# Patient Record
Sex: Male | Born: 1970 | Race: Black or African American | Hispanic: No | Marital: Married | Smoking: Current some day smoker
Health system: Southern US, Community
[De-identification: ages and names within clinical notes are randomized; demographics above are authoritative.]

## PROBLEM LIST (undated history)

## (undated) DIAGNOSIS — I10 Essential (primary) hypertension: Secondary | ICD-10-CM

## (undated) HISTORY — PX: NO PAST SURGERIES: SHX2092

---

## 2002-07-25 ENCOUNTER — Encounter: Payer: Self-pay | Admitting: Emergency Medicine

## 2002-07-25 ENCOUNTER — Emergency Department (HOSPITAL_COMMUNITY): Admission: EM | Admit: 2002-07-25 | Discharge: 2002-07-25 | Payer: Self-pay | Admitting: Emergency Medicine

## 2010-04-16 ENCOUNTER — Emergency Department (HOSPITAL_COMMUNITY): Admission: EM | Admit: 2010-04-16 | Discharge: 2010-04-16 | Payer: Self-pay | Admitting: Emergency Medicine

## 2014-01-13 ENCOUNTER — Encounter (HOSPITAL_COMMUNITY): Payer: Self-pay | Admitting: Emergency Medicine

## 2014-01-13 ENCOUNTER — Observation Stay (HOSPITAL_COMMUNITY)
Admission: EM | Admit: 2014-01-13 | Discharge: 2014-01-14 | Disposition: A | Discharge status: Home / Self Care | Payer: BC Managed Care – PPO | Attending: Cardiovascular Disease | Admitting: Cardiovascular Disease

## 2014-01-13 ENCOUNTER — Emergency Department (HOSPITAL_COMMUNITY): Payer: BC Managed Care – PPO

## 2014-01-13 DIAGNOSIS — R072 Precordial pain: Principal | ICD-10-CM | POA: Insufficient documentation

## 2014-01-13 DIAGNOSIS — K219 Gastro-esophageal reflux disease without esophagitis: Secondary | ICD-10-CM | POA: Insufficient documentation

## 2014-01-13 DIAGNOSIS — I10 Essential (primary) hypertension: Secondary | ICD-10-CM | POA: Insufficient documentation

## 2014-01-13 DIAGNOSIS — F17200 Nicotine dependence, unspecified, uncomplicated: Secondary | ICD-10-CM | POA: Insufficient documentation

## 2014-01-13 DIAGNOSIS — G4730 Sleep apnea, unspecified: Secondary | ICD-10-CM | POA: Insufficient documentation

## 2014-01-13 HISTORY — DX: Essential (primary) hypertension: I10

## 2014-01-13 LAB — BASIC METABOLIC PANEL
ANION GAP: 14 (ref 5–15)
BUN: 13 mg/dL (ref 6–23)
CALCIUM: 9.4 mg/dL (ref 8.4–10.5)
CO2: 26 mEq/L (ref 19–32)
Chloride: 104 mEq/L (ref 96–112)
Creatinine, Ser: 1.21 mg/dL (ref 0.50–1.35)
GFR calc Af Amer: 85 mL/min — ABNORMAL LOW (ref >90)
GFR, EST NON AFRICAN AMERICAN: 73 mL/min — AB (ref >90)
GLUCOSE: 85 mg/dL (ref 70–99)
Potassium: 3.7 mEq/L (ref 3.7–5.3)
Sodium: 144 mEq/L (ref 137–147)

## 2014-01-13 LAB — CBC
HCT: 48.6 % (ref 39.0–52.0)
Hemoglobin: 16.4 g/dL (ref 13.0–17.0)
MCH: 27.6 pg (ref 26.0–34.0)
MCHC: 33.7 g/dL (ref 30.0–36.0)
MCV: 81.8 fL (ref 78.0–100.0)
PLATELETS: 239 10*3/uL (ref 150–400)
RBC: 5.94 MIL/uL — ABNORMAL HIGH (ref 4.22–5.81)
RDW: 12.8 % (ref 11.5–15.5)
WBC: 8.8 10*3/uL (ref 4.0–10.5)

## 2014-01-13 LAB — TROPONIN I: Troponin I: <0.3 ng/mL (ref <0.30)

## 2014-01-13 LAB — I-STAT TROPONIN, ED: Troponin i, poc: 0.02 ng/mL (ref 0.00–0.08)

## 2014-01-13 MED ORDER — ZOLPIDEM TARTRATE 5 MG PO TABS: 5.0000 mg | ORAL_TABLET | Freq: Every evening | ORAL | Status: DC | PRN

## 2014-01-13 MED ORDER — PANTOPRAZOLE SODIUM 40 MG PO TBEC
40.0000 mg | DELAYED_RELEASE_TABLET | Freq: Every day | ORAL | Status: DC
  Administered 2014-01-13 – 2014-01-14 (×2): 40 mg via ORAL
  Filled 2014-01-13 (×2): qty 1

## 2014-01-13 MED ORDER — GI COCKTAIL ~~LOC~~
30.0000 mL | Freq: Once | ORAL | Status: AC
  Administered 2014-01-13: 30 mL via ORAL
  Filled 2014-01-13: qty 30

## 2014-01-13 MED ORDER — ASPIRIN 81 MG PO CHEW: 324.0000 mg | CHEWABLE_TABLET | Freq: Once | ORAL | Status: DC

## 2014-01-13 MED ORDER — ONDANSETRON HCL 4 MG/2ML IJ SOLN: 4.0000 mg | Freq: Four times a day (QID) | INTRAMUSCULAR | Status: DC | PRN

## 2014-01-13 MED ORDER — AMLODIPINE BESYLATE 5 MG PO TABS
5.0000 mg | ORAL_TABLET | Freq: Every day | ORAL | Status: DC
  Administered 2014-01-13 – 2014-01-14 (×2): 5 mg via ORAL
  Filled 2014-01-13 (×2): qty 1

## 2014-01-13 MED ORDER — ENOXAPARIN SODIUM 40 MG/0.4ML ~~LOC~~ SOLN
40.0000 mg | SUBCUTANEOUS | Status: DC
  Filled 2014-01-13: qty 0.4

## 2014-01-13 MED ORDER — ALPRAZOLAM 0.25 MG PO TABS: 0.2500 mg | ORAL_TABLET | Freq: Two times a day (BID) | ORAL | Status: DC | PRN

## 2014-01-13 MED ORDER — ACETAMINOPHEN 325 MG PO TABS: 650.0000 mg | ORAL_TABLET | ORAL | Status: DC | PRN

## 2014-01-13 NOTE — ED Notes (Signed)
Pt back from xray at this time.

## 2014-01-13 NOTE — H&P (Signed)
Physician History and Physical    Jordan GammaMarvin Phillips MRN: [id number] DOB/AGE: 41/01/1971 41 y.o. Admit date: 01/13/2014  Primary Care Physician: None Primary Cardiologist: None  HPI: The patient is a 43 yr old male with no significant past medical history. Approximately two weeks ago, he began experiencing intermittent precordial chest discomfort occurring both with and without exertion. He said it lasts seconds and then spontaneously subsides. Last night before going to work, it was more severe with some radiation to the left side of his neck on one occasion. It is not associated with shortness of breath, nausea, palpitations, or lightheadedness. He denies leg swelling, orthopnea, and PND. He said he's had excessive gas for which he has been using Gas-X, and this alleviates the discomfort along with belching. However, because it has kept recurring, he came to the ED. He does admit to heartburn after eating shrimp and rice last night. He does not see a primary care physician.  Upon speaking with his wife, it appears the patient snores frequently and has had witnessed apneic episodes.  Soc: Married. 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 Smokes cigars. Sometimes drinks a beer or hard liquor once a day, and sometimes once a week.  Fam: Mother died of CHF at age 41, had h/o HTN. Brother (drug abuser) had pacemaker in early 20's.  Review of systems complete and found to be negative unless listed above   No family history of premature CAD in 1st degree relatives. History   Social History  . Marital Status: Single    Spouse Name: N/A    Number of Children: N/A  . Years of Education: N/A   Occupational History  . Not on file.   Social History Main Topics  . Smoking status: Current Some Day Smoker    Types: Cigars  . Smokeless tobacco: Not on file  . Alcohol Use: Yes  . Drug Use: No  . Sexual Activity: Not on file   Other Topics Concern  . Not on file   Social History Narrative  . No  narrative on file      (Not in a hospital admission)  Physical Exam: Blood pressure 142/98, pulse 77, temperature 98.4 F (36.9 C), temperature source Oral, resp. rate 18, height 5\' 9"  (1.753 m), weight 203 lb (92.08 kg), SpO2 100.00%.  General: NAD Neck: No JVD, no thyromegaly or thyroid nodule.  Lungs: Clear to auscultation bilaterally with normal respiratory effort. CV: Nondisplaced PMI.  Heart regular S1/S2, no S3/S4, no murmur.  No peripheral edema.  No carotid bruit.  Normal pedal pulses.  Abdomen: Soft, nontender, no hepatosplenomegaly, no distention.  Skin: Intact without lesions or rashes.  Neurologic: Alert and oriented x 3.  Psych: Normal affect. Extremities: No clubbing or cyanosis.  HEENT: Normal.   Labs:   Lab Results  Component Value Date   WBC 8.8 01/13/2014   HGB 16.4 01/13/2014   HCT 48.6 01/13/2014   MCV 81.8 01/13/2014   PLT 239 01/13/2014    Recent Labs Lab 01/13/14 1806  NA 144  K 3.7  CL 104  CO2 26  BUN 13  CREATININE 1.21  CALCIUM 9.4  GLUCOSE 85   No results found for this basename: CKTOTAL, CKMB, CKMBINDEX, TROPONINI    No results found for this basename: CHOL   No results found for this basename: HDL   No results found for this basename: LDLCALC   No results found for this basename: TRIG   No results found for this basename: CHOLHDL  No results found for this basename: LDLDIRECT       ECG: Normal sinus rhythm with LVH and consequent repolarization abnormalities. Radiology: Normal chest xray.   ASSESSMENT AND PLAN:  1. Chest pain: Very atypical for a cardiac etiology, with some symptoms attributable to GERD. His ECG is notable for LVH and likely has previously undiagnosed hypertension. I will start amlodipine 5 mg daily for the treatment of hypertension. Will start Protonix 40 mg daily for GERD. Will cycle serial troponins to completely rule out a cardiac etiology. Will obtain an echocardiogram in the morning to evaluate LV systolic  function, regional wall motion, and wall thickness (LVH). If troponins are normal, will obtain a treadmill stress echocardiogram.  2. Hypertension: Will start amlodipine 5 mg daily. Would recommend an outpatient sleep study to evaluate for sleep apnea, which may be exacerbating his hypertension.  3. GERD: Will start Protonix 40 mg daily.  4. Sleep apnea: His history is very suggestive of sleep apnea. Would recommend an outpatient sleep study, as sleep apnea may be exacerbating his hypertension.  Signed: Prentice DockerSuresh Daphney Hopke, M.D., F.A.C.C. 01/13/2014, 8:48 PM

## 2014-01-13 NOTE — ED Notes (Signed)
He states hes had L sided chest pain radiating into his neck and jaw since waking this am. He denies any other symptoms. [facility] and [facility] were concerned about his EKG so they sent him to ED for further workup

## 2014-01-13 NOTE — ED Notes (Signed)
Pt states he has been having a lot of gas lately, feels pressure and burning that builds up into his chest and burns his throat.

## 2014-01-13 NOTE — ED Notes (Signed)
Pt to xray at this time.

## 2014-01-13 NOTE — ED Provider Notes (Signed)
CSN: [id number]     Arrival date & time 01/13/14  1756 History   First MD Initiated Contact with Patient 01/13/14 1853     Chief Complaint  Patient presents with  . Chest Pain     (Consider location/radiation/quality/duration/timing/severity/associated sxs/prior Treatment) HPI Comments: Patient with left-sided chest and neck pain has been intermittent since last night. It radiates from his left chest to his neck. Last for minutes to hours at a time. He states it feels like gas he is not had this pain before. Denies any shortness of breath, nausea or vomiting. The pain is not exertional or pleuritic.  No History of hypertension or diabetes. Urgent care said he had an abnormal EKG and was referred to the ED. Denies any leg pain or leg swelling. Nothing makes the pain better or worse. He has not had this pain before yesterday.  The history is provided by the patient.    History reviewed. No pertinent past medical history. History reviewed. No pertinent past surgical history. History reviewed. No pertinent family history. History  Substance Use Topics  . Smoking status: Current Some Day Smoker    Types: Cigars  . Smokeless tobacco: Not on file  . Alcohol Use: Yes    Review of Systems  Constitutional: Negative for fever, activity change and appetite change.  HENT: Negative for congestion and rhinorrhea.   Eyes: Negative for visual disturbance.  Respiratory: Positive for chest tightness. Negative for cough and shortness of breath.   Cardiovascular: Positive for chest pain.  Gastrointestinal: Negative for nausea, vomiting and abdominal pain.  Genitourinary: Negative for dysuria and hematuria.  Musculoskeletal: Negative for arthralgias, back pain and myalgias.  Skin: Negative for rash.  Neurological: Negative for dizziness, weakness and headaches.  A complete 10 system review of systems was obtained and all systems are negative except as noted in the HPI and PMH.      Allergies   Review of patient's allergies indicates no known allergies.  Home Medications   Prior to Admission medications   Medication Sig Start Date End Date Taking? Authorizing Provider  Acetaminophen (TYLENOL PO) Take 2 tablets by mouth daily as needed (for headache or pain).   Yes Historical Provider, MD  ibuprofen (ADVIL,MOTRIN) 200 MG tablet Take 800 mg by mouth every 6 (six) hours as needed.   Yes Historical Provider, MD   BP 146/92  Pulse 68  Temp(Src) 97.9 F (36.6 C) (Oral)  Resp 18  Ht 5\' 9"  (1.753 m)  Wt 196 lb 12.8 oz (89.268 kg)  BMI 29.05 kg/m2  SpO2 97% Physical Exam  Nursing note and vitals reviewed. Constitutional: He is oriented to person, place, and time. He appears well-developed and well-nourished. No distress.  HENT:  Head: Normocephalic and atraumatic.  Mouth/Throat: Oropharynx is clear and moist. No oropharyngeal exudate.  Eyes: Conjunctivae and EOM are normal. Pupils are equal, round, and reactive to light.  Neck: Normal range of motion. Neck supple.  No meningismus.  Cardiovascular: Normal rate, regular rhythm, normal heart sounds and intact distal pulses.   No murmur heard. Pulmonary/Chest: Effort normal and breath sounds normal. No respiratory distress.  Abdominal: Soft. There is no tenderness. There is no rebound and no guarding.  Musculoskeletal: Normal range of motion. He exhibits no edema and no tenderness.  Neurological: He is alert and oriented to person, place, and time. No cranial nerve deficit. He exhibits normal muscle tone. Coordination normal.  No ataxia on finger to nose bilaterally. No pronator drift. 5/5 strength throughout. CN  2-12 intact. Negative Romberg. Equal grip strength. Sensation intact. Gait is normal.   Skin: Skin is warm.  Psychiatric: He has a normal mood and affect. His behavior is normal.    ED Course  Procedures (including critical care time) Labs Review Labs Reviewed  CBC - Abnormal; Notable for the following:    RBC 5.94  (*)    All other components within normal limits  BASIC METABOLIC PANEL - Abnormal; Notable for the following:    GFR calc non Af Amer 73 (*)    GFR calc Af Amer 85 (*)    All other components within normal limits  TROPONIN I  TROPONIN I  TROPONIN I  TROPONIN I  I-STAT TROPOININ, ED    Imaging Review Dg Chest 2 View  01/13/2014   CLINICAL DATA:  Left-sided chest pain radiating to neck and jaw.  EXAM: CHEST  2 VIEW  COMPARISON:  None.  FINDINGS: The heart size and mediastinal contours are within normal limits. Both lungs are clear. The visualized skeletal structures are unremarkable.  IMPRESSION: No active cardiopulmonary disease.   Electronically Signed   By: Myles RosenthalJohn  Stahl M.D.   On: 01/13/2014 20:11     EKG Interpretation   Date/Time:  Monday January 13 2014 18:04:29 EDT Ventricular Rate:  98 PR Interval:  152 QRS Duration: 78 QT Interval:  360 QTC Calculation: 459 R Axis:   72 Text Interpretation:  Normal sinus rhythm Left ventricular hypertrophy  with repolarization abnormality Nonspecific ST abnormality Abnormal ECG No  previous ECGs available Confirmed by Manus GunningANCOUR  MD, Fiora Weill [id number]) on  01/13/2014 7:04:36 PM      MDM   Final diagnoses:  Precordial pain  Essential hypertension  Gastroesophageal reflux disease, esophagitis presence not specified  Sleep apnea   L sided chest pain radiating to neck, intermittent since yesterday.  No SOB, nausea, diaphoresis.  EKG with LVH and T wave inversions. No comparison.  Cardiology consulted in setting of abnormal EKG and chest pain, though appears atypical for ACS. Cardiology agrees atypical for ACS but will admit for rule out and echo.     Glynn OctaveStephen Leitha Hyppolite, MD 01/14/14 [phone]

## 2014-01-14 ENCOUNTER — Other Ambulatory Visit: Payer: Self-pay | Admitting: General Surgery

## 2014-01-14 ENCOUNTER — Encounter (HOSPITAL_COMMUNITY): Payer: Self-pay | Admitting: General Practice

## 2014-01-14 ENCOUNTER — Other Ambulatory Visit: Payer: Self-pay | Admitting: Cardiology

## 2014-01-14 DIAGNOSIS — R0683 Snoring: Secondary | ICD-10-CM

## 2014-01-14 DIAGNOSIS — R072 Precordial pain: Secondary | ICD-10-CM

## 2014-01-14 DIAGNOSIS — I10 Essential (primary) hypertension: Secondary | ICD-10-CM

## 2014-01-14 DIAGNOSIS — G4730 Sleep apnea, unspecified: Secondary | ICD-10-CM

## 2014-01-14 DIAGNOSIS — K219 Gastro-esophageal reflux disease without esophagitis: Secondary | ICD-10-CM

## 2014-01-14 DIAGNOSIS — I517 Cardiomegaly: Secondary | ICD-10-CM

## 2014-01-14 LAB — TROPONIN I
Troponin I: <0.3 ng/mL (ref <0.30)
Troponin I: <0.3 ng/mL (ref <0.30)

## 2014-01-14 MED ORDER — PANTOPRAZOLE SODIUM 40 MG PO TBEC: 40.0000 mg | DELAYED_RELEASE_TABLET | Freq: Every day | ORAL | Status: AC

## 2014-01-14 MED ORDER — AMLODIPINE BESYLATE 5 MG PO TABS: 5.0000 mg | ORAL_TABLET | Freq: Every day | ORAL | Status: AC

## 2014-01-14 NOTE — Discharge Summary (Signed)
Agree with discharge summary as outlined by Brittainy Simmons, PA-C 

## 2014-01-14 NOTE — Progress Notes (Signed)
Utilization Review Completed.Jordan Phillips T7/01/2014  

## 2014-01-14 NOTE — Progress Notes (Signed)
SUBJECTIVE:  No CP  OBJECTIVE:   Vitals:   Filed Vitals:   01/13/14 2130 01/13/14 2215 01/14/14 0030 01/14/14 0508  BP: 148/103 147/47 146/92 135/79  Pulse: 72 70 68 72  Temp:  98.4 F (36.9 C) 97.9 F (36.6 C) 97.8 F (36.6 C)  TempSrc:  Oral Oral Oral  Resp: 18 18 18 18   Height:  5\' 9"  (1.753 m)    Weight:  196 lb 12.8 oz (89.268 kg)  197 lb 12 oz (89.7 kg)  SpO2: 99% 96% 97% 98%   I&O's:  No intake or output data in the 24 hours ending 01/14/14 0929 TELEMETRY: Reviewed telemetry pt in NSR:     PHYSICAL EXAM General: Well developed, well nourished, in no acute distress Head: Eyes PERRLA, No xanthomas.   Normal cephalic and atramatic  Lungs:   Clear bilaterally to auscultation and percussion. Heart:   HRRR S1 S2 Pulses are 2+ & equal. Abdomen: Bowel sounds are positive, abdomen soft and non-tender without masses Extremities:   No clubbing, cyanosis or edema.  DP +1 Neuro: Alert and oriented X 3. Psych:  Good affect, responds appropriately   LABS: Basic Metabolic Panel:  Recent Labs  16/04/9606/06/15 1806  NA 144  K 3.7  CL 104  CO2 26  GLUCOSE 85  BUN 13  CREATININE 1.21  CALCIUM 9.4   Liver Function Tests: No results found for this basename: AST, ALT, ALKPHOS, BILITOT, PROT, ALBUMIN,  in the last 72 hours No results found for this basename: LIPASE, AMYLASE,  in the last 72 hours CBC:  Recent Labs  01/13/14 1806  WBC 8.8  HGB 16.4  HCT 48.6  MCV 81.8  PLT 239   Cardiac Enzymes:  Recent Labs  01/13/14 2050 01/14/14 0025 01/14/14 0531  TROPONINI <0.30 <0.30 <0.30   BNP: No components found with this basename: POCBNP,  D-Dimer: No results found for this basename: DDIMER,  in the last 72 hours Hemoglobin A1C: No results found for this basename: HGBA1C,  in the last 72 hours Fasting Lipid Panel: No results found for this basename: CHOL, HDL, LDLCALC, TRIG, CHOLHDL, LDLDIRECT,  in the last 72 hours Thyroid Function Tests: No results found for  this basename: TSH, T4TOTAL, FREET3, T3FREE, THYROIDAB,  in the last 72 hours Anemia Panel: No results found for this basename: VITAMINB12, FOLATE, FERRITIN, TIBC, IRON, RETICCTPCT,  in the last 72 hours Coag Panel:   No results found for this basename: INR, PROTIME    RADIOLOGY: Dg Chest 2 View  01/13/2014   CLINICAL DATA:  Left-sided chest pain radiating to neck and jaw.  EXAM: CHEST  2 VIEW  COMPARISON:  None.  FINDINGS: The heart size and mediastinal contours are within normal limits. Both lungs are clear. The visualized skeletal structures are unremarkable.  IMPRESSION: No active cardiopulmonary disease.   Electronically Signed   By: Myles RosenthalJohn  Stahl M.D.   On: 01/13/2014 20:11   ASSESSMENT AND PLAN:  1. Chest pain: Very atypical for a cardiac etiology, with some symptoms attributable to GERD. His ECG is notable for LVH and likely has previously undiagnosed hypertension. Cardiac enzymes are negative x 3.  2D echocardiogram pending to evaluate LV systolic function, regional wall motion, and wall thickness (LVH). Will obtain a treadmill stress echocardiogram to rule out ischemia. 2. Hypertension: Improved after addition of Amlodipine.  Continue amlodipine 5 mg daily. Would recommend an outpatient sleep study to evaluate for sleep apnea, which may be exacerbating his hypertension.  3. GERD:  Protonix 40 mg daily.  4. Sleep apnea: His history is very suggestive of sleep apnea. Would recommend an outpatient sleep study, as sleep apnea may be exacerbating his hypertension.     Quintella ReichertURNER,Woodroe Vogan R, MD  01/14/2014  9:29 AM

## 2014-01-14 NOTE — Progress Notes (Signed)
2D echo showed normal LVF with mild LVH which would account for LVH on  EKG. Stress echo showed no ischemia.  OK to d/c home.  Noncardiac CP most likely GI in etiology.  Recommended d/c home on Protonix.  Will set up outpt sleep study.  Continue amlodipine. Followup with me in 6 weeks

## 2014-01-14 NOTE — Progress Notes (Addendum)
Excellent exercise tolerance. Exercised 8:15, test terminated due to persistent hypertension (peak BP 213/93). Mild dyspnea at end of test. No CP. Abnormal EKG at baseline > LVH with repol changes, similar with exercise. BP came down after exercise appropriately. Had not yet gotten BP meds prior to exercise. Emilian Stawicki PA-C

## 2014-01-14 NOTE — Progress Notes (Signed)
  Echocardiogram 2D Echocardiogram has been performed.  Arvil ChacoFoster, Hollie Bartus 01/14/2014, 9:49 AM

## 2014-01-14 NOTE — Discharge Summary (Signed)
Physician Discharge Summary  Patient ID: Jordan Phillips MRN: [id number] DOB/AGE: 41/01/1971 41 y.o.  Admit date: 01/13/2014 Discharge date: 01/14/2014  Primary Cardiologist: (New) Dr. Mayford Knifeurner  Admission Diagnoses: Precordial Chest Pain  Discharge Diagnoses:  Active Problems:   Precordial pain   HTN (hypertension)   GERD (gastroesophageal reflux disease)   Discharged Condition: stable  Hospital Course: The patient is a 43 yr old male with no significant past medical history who [facility] with a complaint of chest pain. He stated that approximately two weeks ago, he began experiencing intermittent precordial chest discomfort occurring both with and without exertion. He said it lasts seconds and then spontaneously subsides. His pain is not associated with SOB, nausea, palpitations or lightheadedness. He noted excessive gas, which was improved with Gas-X. However, due to recurrent pain, he presented to the ED.   His EKG in the ED was notable for LVH. His BP on arrival was 142/98. It was presumed that he likely had a long history of undiagnosed HTN. Subsequently, he was started on 5 mg of amlodipine. He was also started on 40 mg of Protonix, as GERD was felt to be a likely etiology of his symptoms. He was admitted for further observation/ w/u to rule out cardiac etiologies. Cardiac enzymes were cycled and were negative x 3. He underwent a stress echo which was normal. It was felt that his pain was likely non cardiac. It was recommended that he continue with amlodipine for HTN and Protonix for GERD.   It should also be noted that during the time of his H&P, his wife noted that he snores frequently and he apparently has had witnessed apneic episodes. As his history was very suggestive of sleep apnea, it was recommended that he undergo an outpatient sleep study, as sleep apnea may be exacerbating his HTN. An order was placed with our office for him to be contacted at home to have this  arranged. This will be followed by Dr. Mayford Knifeurner.   He was last seen and examined by Dr. Mayford Knifeurner, who determined he was stable for discharge home.    Consults: None  Significant Diagnostic Studies:   Treatments: See Hospital Course  Discharge Exam: Blood pressure 137/86, pulse 72, temperature 98.4 F (36.9 C), temperature source Oral, resp. rate 20, height 5\' 9"  (1.753 m), weight 197 lb 12 oz (89.7 kg), SpO2 99.00%.   Disposition: Final discharge disposition not confirmed      Discharge Instructions   Diet - low sodium heart healthy    Complete by:  As directed      Increase activity slowly    Complete by:  As directed             Medication List         amLODipine 5 MG tablet  Commonly known as:  NORVASC  Take 1 tablet (5 mg total) by mouth daily.     ibuprofen 200 MG tablet  Commonly known as:  ADVIL,MOTRIN  Take 800 mg by mouth every 6 (six) hours as needed.     pantoprazole 40 MG tablet  Commonly known as:  PROTONIX  Take 1 tablet (40 mg total) by mouth daily.     TYLENOL PO  Take 2 tablets by mouth daily as needed (for headache or pain).       Follow-up Information   [facility]. (someone will call your for an appointment for your sleep study)    Contact  information:   [address] [place] [id number]       Follow up with Quintella ReichertURNER,TRACI R, MD. (our office will contact you to follow-up with Dr. Mayford Knifeurner after your sleep study)    Specialty:  Cardiology   Contact information:   [address] [place] [address] [phone]       TIME SPENT ON DISCHARGE, INCLUDING PHYSICIAN TIME: >30 MINUTES  Signed: Robbie LisSIMMONS, Lorian Yaun 01/14/2014, 3:53 PM

## 2014-03-23 ENCOUNTER — Encounter (HOSPITAL_BASED_OUTPATIENT_CLINIC_OR_DEPARTMENT_OTHER): Payer: BC Managed Care – PPO

## 2014-05-12 ENCOUNTER — Ambulatory Visit (HOSPITAL_BASED_OUTPATIENT_CLINIC_OR_DEPARTMENT_OTHER): Payer: BC Managed Care – PPO

## 2014-05-13 ENCOUNTER — Encounter (HOSPITAL_BASED_OUTPATIENT_CLINIC_OR_DEPARTMENT_OTHER): Payer: Self-pay | Admitting: *Deleted

## 2014-05-13 NOTE — Progress Notes (Signed)
Sleep Study - 9/13, 11/2  Second "No Call/No Show"  Patient will not be rescheduled unless requested by [facility]

## 2014-05-13 NOTE — Progress Notes (Signed)
Please call patient to find out why he cancelled and reschedule

## 2014-05-21 ENCOUNTER — Telehealth: Payer: Self-pay | Admitting: Cardiology

## 2014-05-21 NOTE — Telephone Encounter (Signed)
Pt was on the road, his split night sleep study has been rescheduled to 08/06/2014

## 2014-07-24 ENCOUNTER — Ambulatory Visit: Payer: Self-pay | Admitting: Dietician

## 2014-08-05 ENCOUNTER — Telehealth: Payer: Self-pay | Admitting: Cardiovascular Disease

## 2014-08-05 NOTE — Telephone Encounter (Signed)
Pt scheduled [facility] at [facility] 08-07-14.  Per [place], [facility], [phone], pt doesn't meet medical criteria.  Msg to Dr. Purvis SheffieldKoneswaran to see if he wants to do a home sleep study (would not require precert).  Spoke w/pt's wife who is aware we will call back w/further instructions

## 2014-08-06 ENCOUNTER — Encounter (HOSPITAL_BASED_OUTPATIENT_CLINIC_OR_DEPARTMENT_OTHER): Payer: BC Managed Care – PPO

## 2014-08-07 ENCOUNTER — Encounter (HOSPITAL_BASED_OUTPATIENT_CLINIC_OR_DEPARTMENT_OTHER): Payer: 59

## 2014-08-07 NOTE — Telephone Encounter (Signed)
Left msg on pt's and pt's wife's phone that sleep [email or web address]  cancelled.  [facility] denied.  Will do home sleep study.  Nurse to contact pt w/info.

## 2014-10-02 ENCOUNTER — Encounter: Payer: Self-pay | Admitting: Cardiology

## 2014-10-06 ENCOUNTER — Telehealth: Payer: Self-pay

## 2014-10-06 DIAGNOSIS — R0683 Snoring: Secondary | ICD-10-CM

## 2014-10-06 NOTE — Telephone Encounter (Signed)
-----   Message from Quintella Reichertraci R Turner, MD sent at 10/06/2014  6:00 PM EDT ----- If he snores please let him know that we could refer him to ENT for evaluation is he would like.  He needs to practice good sleep hygiene as you recommended

## 2014-10-06 NOTE — Telephone Encounter (Signed)
Patient agrees to ENT referral.  Referral placed.

## 2017-05-29 ENCOUNTER — Emergency Department (HOSPITAL_COMMUNITY): Payer: Worker's Compensation

## 2017-05-29 ENCOUNTER — Encounter (HOSPITAL_COMMUNITY): Payer: Self-pay | Admitting: Emergency Medicine

## 2017-05-29 ENCOUNTER — Emergency Department (HOSPITAL_COMMUNITY)
Admission: EM | Admit: 2017-05-29 | Discharge: 2017-05-30 | Disposition: A | Discharge status: Home / Self Care | Payer: Worker's Compensation | Attending: Emergency Medicine | Admitting: Emergency Medicine

## 2017-05-29 DIAGNOSIS — Y939 Activity, unspecified: Secondary | ICD-10-CM | POA: Insufficient documentation

## 2017-05-29 DIAGNOSIS — I10 Essential (primary) hypertension: Secondary | ICD-10-CM | POA: Insufficient documentation

## 2017-05-29 DIAGNOSIS — F17200 Nicotine dependence, unspecified, uncomplicated: Secondary | ICD-10-CM | POA: Insufficient documentation

## 2017-05-29 DIAGNOSIS — Y929 Unspecified place or not applicable: Secondary | ICD-10-CM | POA: Diagnosis not present

## 2017-05-29 DIAGNOSIS — Z79899 Other long term (current) drug therapy: Secondary | ICD-10-CM | POA: Diagnosis not present

## 2017-05-29 DIAGNOSIS — S63501A Unspecified sprain of right wrist, initial encounter: Secondary | ICD-10-CM | POA: Diagnosis not present

## 2017-05-29 DIAGNOSIS — S63509A Unspecified sprain of unspecified wrist, initial encounter: Secondary | ICD-10-CM

## 2017-05-29 DIAGNOSIS — Y990 Civilian activity done for income or pay: Secondary | ICD-10-CM | POA: Diagnosis not present

## 2017-05-29 DIAGNOSIS — S63502A Unspecified sprain of left wrist, initial encounter: Secondary | ICD-10-CM | POA: Diagnosis not present

## 2017-05-29 DIAGNOSIS — W19XXXA Unspecified fall, initial encounter: Secondary | ICD-10-CM | POA: Diagnosis not present

## 2017-05-29 DIAGNOSIS — M25531 Pain in right wrist: Secondary | ICD-10-CM | POA: Diagnosis present

## 2017-05-29 MED ORDER — NAPROXEN 250 MG PO TABS
500.0000 mg | ORAL_TABLET | Freq: Once | ORAL | Status: AC
  Administered 2017-05-30: 500 mg via ORAL
  Filled 2017-05-29: qty 2

## 2017-05-29 MED ORDER — NAPROXEN 500 MG PO TABS: 500.0000 mg | ORAL_TABLET | Freq: Two times a day (BID) | ORAL | 0 refills | Status: AC

## 2017-05-29 NOTE — ED Provider Notes (Signed)
[facility]   CSN: [id number] Arrival date & time: 05/29/17  2001     History   Chief Complaint Chief Complaint  Patient presents with  . Wrist Pain    HPI Jordan Phillips is a 46 y.o. male.  46 year old male presents to the emergency department for bilateral wrist pain.  He states that he fell while he was working landing on both outstretched hands.  He has had persistent wrist pain which is constant and worsening for which he has not taken any medication.  Pain does not radiate and is worse with wrist movement.  No history of prior injury to the wrist.  He denies any numbness, tingling, weakness.      Past Medical History:  Diagnosis Date  . Hypertension dx'd 01/2014    Patient Active Problem List   Diagnosis Date Noted  . HTN (hypertension) 01/14/2014  . GERD (gastroesophageal reflux disease) 01/14/2014  . Precordial pain 01/13/2014    Past Surgical History:  Procedure Laterality Date  . NO PAST SURGERIES         Home Medications    Prior to Admission medications   Medication Sig Start Date End Date Taking? Authorizing Provider  Acetaminophen (TYLENOL PO) Take 2 tablets by mouth daily as needed (for headache or pain).    [provider]  amLODipine (NORVASC) 5 MG tablet Take 1 tablet (5 mg total) by mouth daily. 01/14/14   Robbie LisSimmons, Brittainy M, PA-C  naproxen (NAPROSYN) 500 MG tablet Take 1 tablet (500 mg total) 2 (two) times daily by mouth. 05/29/17   Antony MaduraHumes, Vertis Bauder, PA-C  pantoprazole (PROTONIX) 40 MG tablet Take 1 tablet (40 mg total) by mouth daily. 01/14/14   Allayne ButcherSimmons, Brittainy M, PA-C    Family History No family history on file.  Social History Social History   Tobacco Use  . Smoking status: Current Some Day Smoker    Years: 23.00    Types: Cigars  . Smokeless tobacco: Never Used  Substance Use Topics  . Alcohol use: Yes    Alcohol/week: 3.6 oz    Types: 3 Cans of beer, 3 Shots of liquor  per week  . Drug use: No     Allergies   Patient has no known allergies.   Review of Systems Review of Systems Ten systems reviewed and are negative for acute change, except as noted in the HPI.    Physical Exam Updated Vital Signs BP (!) 153/102 (BP Location: Right Arm)   Pulse 97   Temp 98.6 F (37 C) (Oral)   Resp 16   Ht 5\' 9"  (1.753 m)   Wt 92.1 kg (203 lb)   SpO2 96%   BMI 29.98 kg/m   Physical Exam  Constitutional: He is oriented to person, place, and time. He appears well-developed and well-nourished. No distress.  Nontoxic appearing and in no acute distress  HENT:  Head: Normocephalic and atraumatic.  Eyes: Conjunctivae and EOM are normal. No scleral icterus.  Neck: Normal range of motion.  Cardiovascular: Normal rate, regular rhythm and intact distal pulses.  Distal radial pulse 2+ bilaterally  Pulmonary/Chest: Effort normal. No respiratory distress.  Respirations even and unlabored  Musculoskeletal: Normal range of motion.  Mild tenderness to the ulnar aspect of the wrists bilaterally.  No significant swelling.  No effusion, crepitus, deformity.  Patient with normal active range of motion of bilateral wrists.  Neurological: He is alert and oriented to person, place, and time. He exhibits normal  muscle tone. Coordination normal.  Sensation to light touch intact bilaterally.  Grip strength 5/5 bilaterally.  Patient able to wiggle all fingers.  Sensation to light touch intact.  Skin: Skin is warm and dry. No rash noted. He is not diaphoretic. No erythema. No pallor.  Psychiatric: He has a normal mood and affect. His behavior is normal.  Nursing note and vitals reviewed.    ED Treatments / Results  Labs (all labs ordered are listed, but only abnormal results are displayed) Labs Reviewed - No data to display  EKG  EKG Interpretation None       Radiology Dg Wrist Complete Left  Result Date: 05/29/2017 CLINICAL DATA:  Fall today onto outstretched  hand, reports bilateral wrist pain, reports right wrist is more severe than left. Pain during ulnar deviation for both wrist. EXAM: LEFT WRIST - COMPLETE 3+ VIEW COMPARISON:  None. FINDINGS: There is no evidence of fracture or dislocation. There is no evidence of arthropathy or other focal bone abnormality. Soft tissues are unremarkable. IMPRESSION: Negative. Electronically Signed   By: Burman NievesWilliam  Stevens M.D.   On: 05/29/2017 21:44   Dg Wrist Complete Right  Result Date: 05/29/2017 CLINICAL DATA:  46 year old male with fall and right wrist pain. EXAM: RIGHT WRIST - COMPLETE 3+ VIEW COMPARISON:  None. FINDINGS: There is no evidence of fracture or dislocation. There is no evidence of arthropathy or other focal bone abnormality. Soft tissues are unremarkable. IMPRESSION: Negative. Electronically Signed   By: Elgie CollardArash  Radparvar M.D.   On: 05/29/2017 21:42    Procedures Procedures (including critical care time)  Medications Ordered in ED Medications  naproxen (NAPROSYN) tablet 500 mg (not administered)     Initial Impression / Assessment and Plan / ED Course  I have reviewed the triage vital signs and the nursing notes.  Pertinent labs & imaging results that were available during my care of the patient were reviewed by me and considered in my medical decision making (see chart for details).     Patient presents to the emergency department for evaluation of bilateral wrist pain 2/2 a FOOH at work. Patient neurovascularly intact on exam. Imaging negative for fracture, dislocation, bony deformity. Plan for supportive management including RICE and NSAIDs; primary care follow up as needed. Wrist braces given. Return precautions discussed and provided. Patient discharged in stable condition with no unaddressed concerns.   Final Clinical Impressions(s) / ED Diagnoses   Final diagnoses:  Sprain of wrist, unspecified laterality, initial encounter    ED Discharge Orders        Ordered    naproxen  (NAPROSYN) 500 MG tablet  2 times daily     05/29/17 2354       Antony MaduraHumes, Alissia Lory, PA-C 05/30/17 0013    Shon BatonHorton, Courtney F, MD 05/30/17 [id number]

## 2017-05-29 NOTE — ED Triage Notes (Signed)
Patient reports bilateral wrist pain injured today after a fall today at work from a bed of a truck, denies LOC/ambulatory , no deformity or swelling noted with full ROM .

## 2019-04-07 IMAGING — DX DG WRIST COMPLETE 3+V*L*
4 series · 4 of 4 positions shown · non-contrast
Comparison: None.

CLINICAL DATA: Fall today onto outstretched hand, reports bilateral
wrist pain, reports right wrist is more severe than left. Pain
during ulnar deviation for both wrist.

EXAM:
LEFT WRIST - COMPLETE 3+ VIEW

[wrist ap]
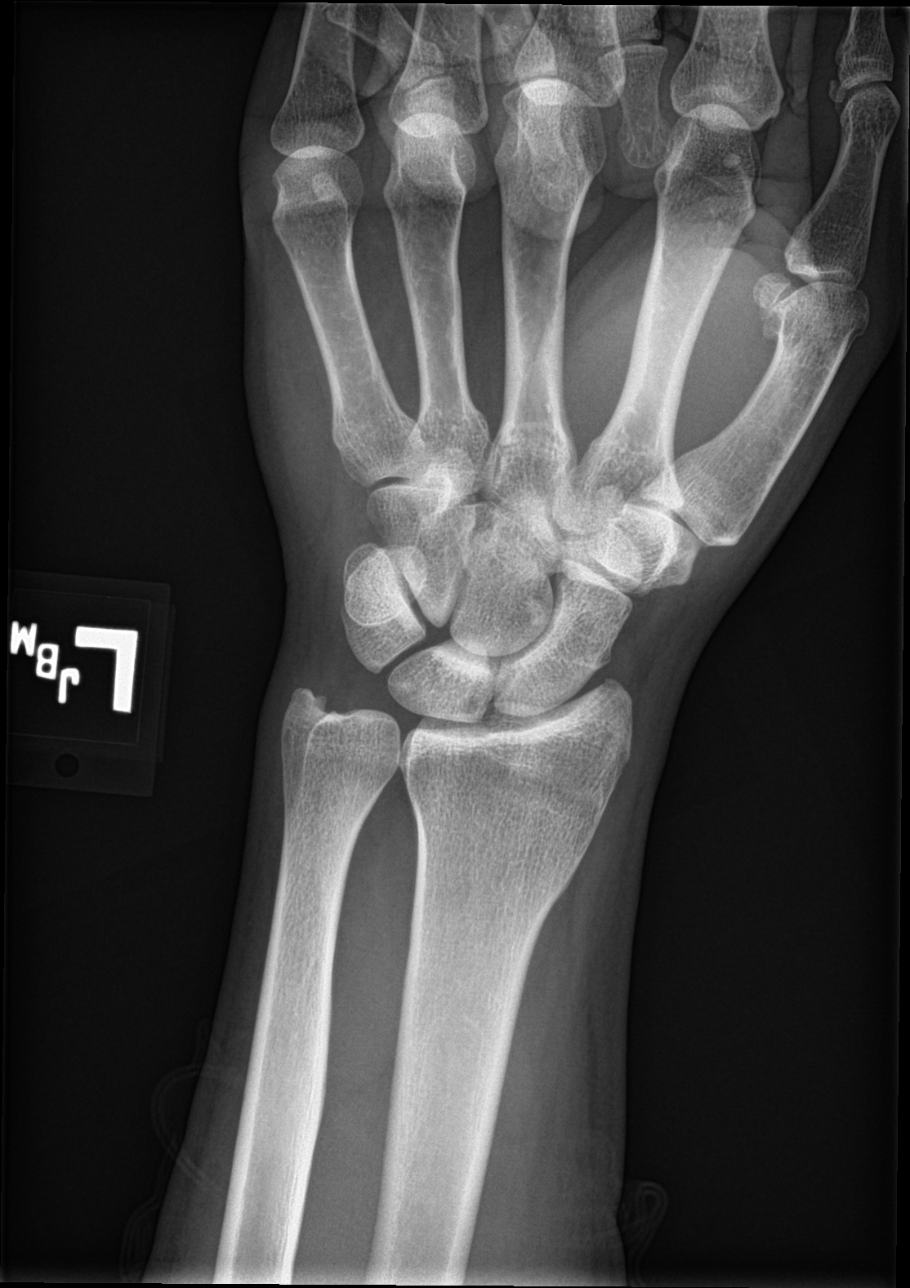

[wrist obl]
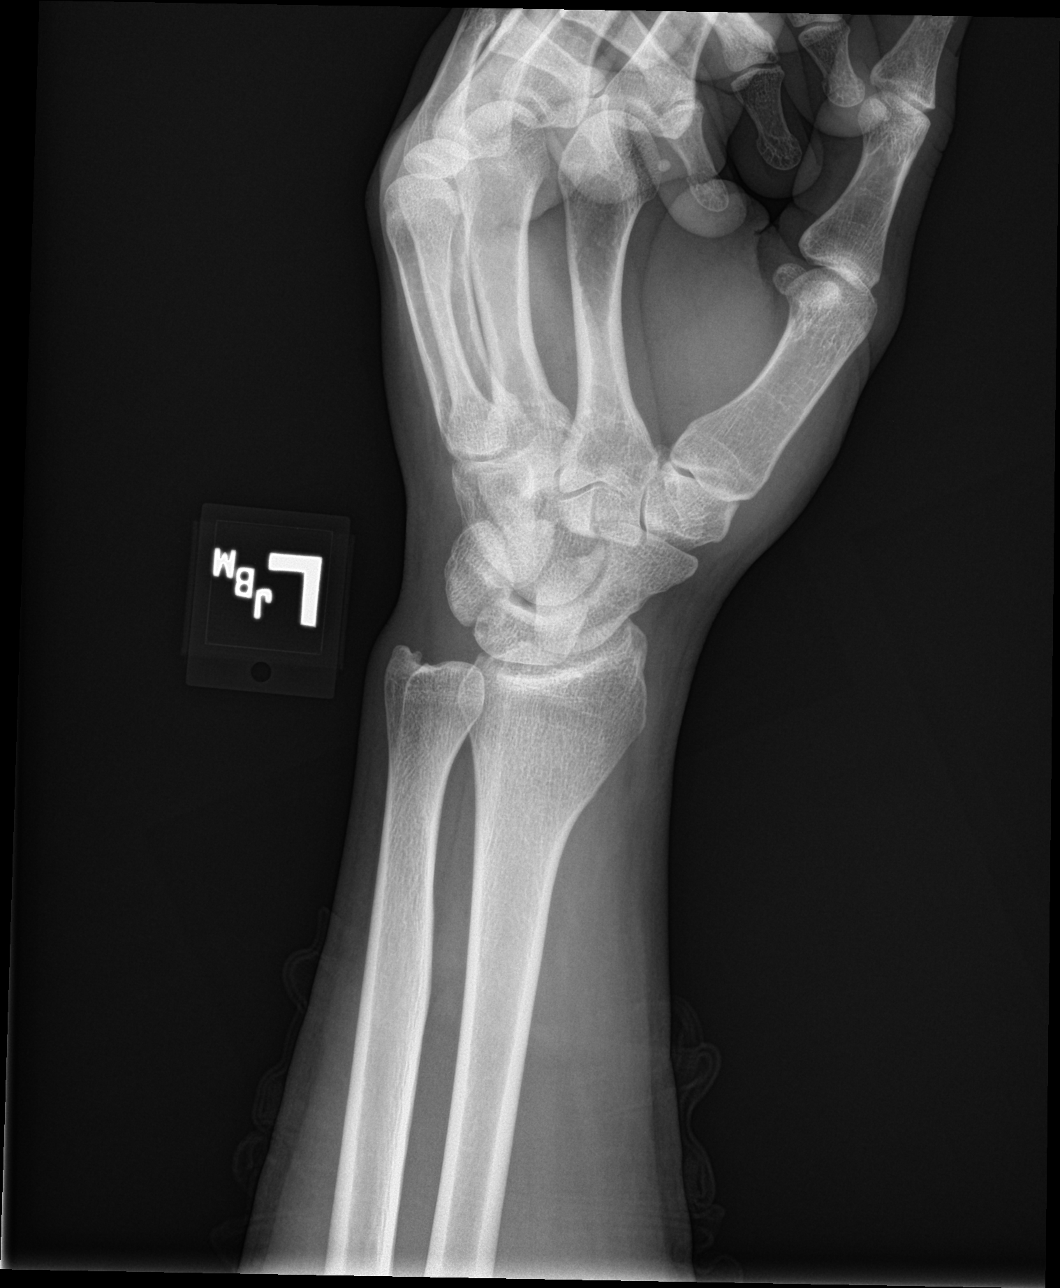

[wrist lat]
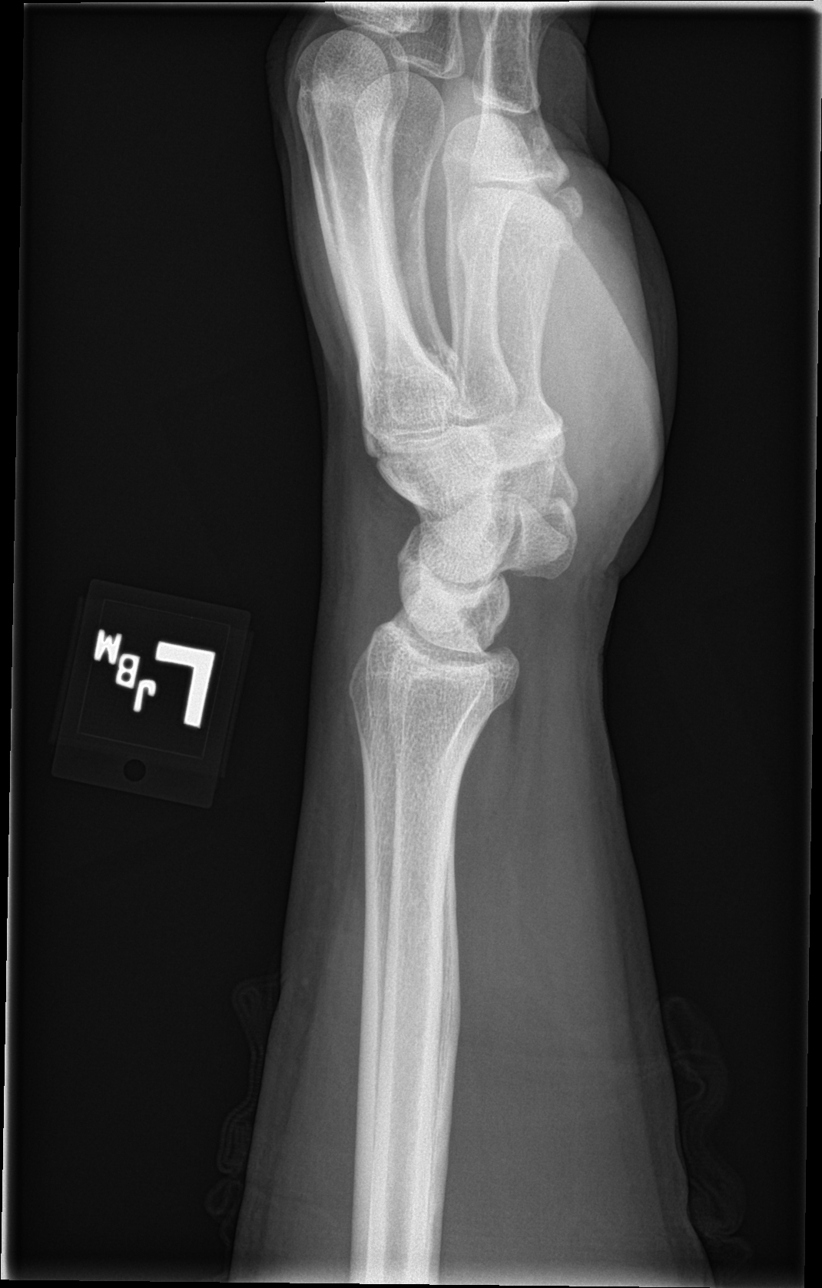

[wrist navicular]
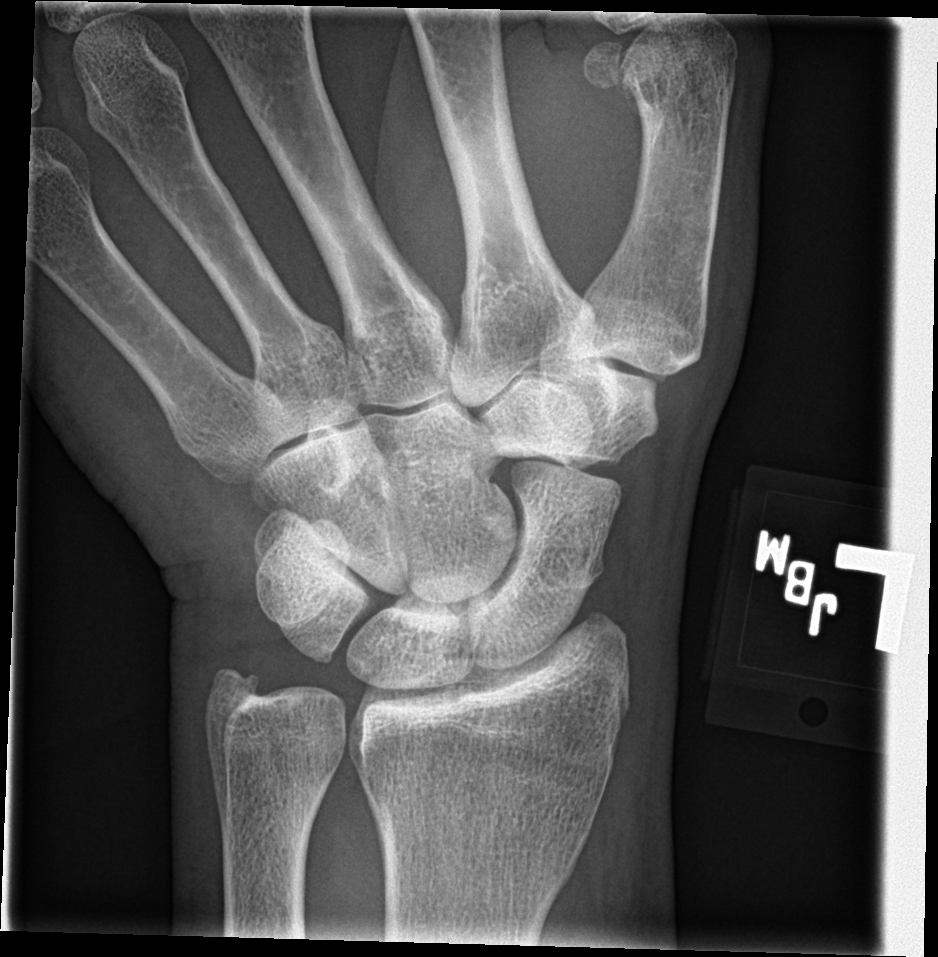

[4 of 4 positions shown; findings below may reference images not displayed]

FINDINGS: There is no evidence of fracture or dislocation. There is no
evidence of arthropathy or other focal bone abnormality. Soft
tissues are unremarkable.
IMPRESSION: Negative.

## 2022-09-12 DIAGNOSIS — I10 Essential (primary) hypertension: Secondary | ICD-10-CM | POA: Diagnosis not present
